# Patient Record
Sex: Female | Born: 2005
Health system: Southern US, Community
[De-identification: ages and names within clinical notes are randomized; demographics above are authoritative.]

## PROBLEM LIST (undated history)

## (undated) DIAGNOSIS — Z1589 Genetic susceptibility to other disease: Secondary | ICD-10-CM

## (undated) DIAGNOSIS — E7212 Methylenetetrahydrofolate reductase deficiency: Secondary | ICD-10-CM

## (undated) DIAGNOSIS — G90A Postural orthostatic tachycardia syndrome (POTS): Secondary | ICD-10-CM

## (undated) DIAGNOSIS — Q796 Ehlers-Danlos syndrome, unspecified: Secondary | ICD-10-CM

## (undated) DIAGNOSIS — M249 Joint derangement, unspecified: Secondary | ICD-10-CM

## (undated) DIAGNOSIS — J45909 Unspecified asthma, uncomplicated: Secondary | ICD-10-CM

## (undated) HISTORY — DX: Genetic susceptibility to other disease: Z15.89

## (undated) HISTORY — DX: Unspecified asthma, uncomplicated: J45.909

## (undated) HISTORY — DX: Joint derangement, unspecified: M24.9

---
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
## 2023-01-19 DIAGNOSIS — Z419 Encounter for procedure for purposes other than remedying health state, unspecified: Secondary | ICD-10-CM | POA: Diagnosis not present

## 2023-02-18 DIAGNOSIS — Z419 Encounter for procedure for purposes other than remedying health state, unspecified: Secondary | ICD-10-CM | POA: Diagnosis not present

## 2023-03-21 DIAGNOSIS — Z419 Encounter for procedure for purposes other than remedying health state, unspecified: Secondary | ICD-10-CM | POA: Diagnosis not present

## 2023-04-20 DIAGNOSIS — Z419 Encounter for procedure for purposes other than remedying health state, unspecified: Secondary | ICD-10-CM | POA: Diagnosis not present

## 2023-05-21 DIAGNOSIS — Z419 Encounter for procedure for purposes other than remedying health state, unspecified: Secondary | ICD-10-CM | POA: Diagnosis not present

## 2023-06-21 DIAGNOSIS — Z419 Encounter for procedure for purposes other than remedying health state, unspecified: Secondary | ICD-10-CM | POA: Diagnosis not present

## 2023-07-21 DIAGNOSIS — Z419 Encounter for procedure for purposes other than remedying health state, unspecified: Secondary | ICD-10-CM | POA: Diagnosis not present

## 2023-08-21 DIAGNOSIS — Z419 Encounter for procedure for purposes other than remedying health state, unspecified: Secondary | ICD-10-CM | POA: Diagnosis not present

## 2023-09-20 DIAGNOSIS — Z419 Encounter for procedure for purposes other than remedying health state, unspecified: Secondary | ICD-10-CM | POA: Diagnosis not present

## 2023-10-21 DIAGNOSIS — Z419 Encounter for procedure for purposes other than remedying health state, unspecified: Secondary | ICD-10-CM | POA: Diagnosis not present

## 2023-11-21 DIAGNOSIS — Z419 Encounter for procedure for purposes other than remedying health state, unspecified: Secondary | ICD-10-CM | POA: Diagnosis not present

## 2023-12-19 DIAGNOSIS — Z419 Encounter for procedure for purposes other than remedying health state, unspecified: Secondary | ICD-10-CM | POA: Diagnosis not present

## 2024-01-30 DIAGNOSIS — Z419 Encounter for procedure for purposes other than remedying health state, unspecified: Secondary | ICD-10-CM | POA: Diagnosis not present

## 2024-02-29 DIAGNOSIS — Z419 Encounter for procedure for purposes other than remedying health state, unspecified: Secondary | ICD-10-CM | POA: Diagnosis not present

## 2024-03-24 DIAGNOSIS — Z79899 Other long term (current) drug therapy: Secondary | ICD-10-CM | POA: Diagnosis not present

## 2024-03-24 DIAGNOSIS — F649 Gender identity disorder, unspecified: Secondary | ICD-10-CM | POA: Diagnosis not present

## 2024-03-24 DIAGNOSIS — F640 Transsexualism: Secondary | ICD-10-CM | POA: Diagnosis not present

## 2024-03-31 DIAGNOSIS — Z419 Encounter for procedure for purposes other than remedying health state, unspecified: Secondary | ICD-10-CM | POA: Diagnosis not present

## 2024-04-30 DIAGNOSIS — Z419 Encounter for procedure for purposes other than remedying health state, unspecified: Secondary | ICD-10-CM | POA: Diagnosis not present

## 2024-05-31 DIAGNOSIS — Z419 Encounter for procedure for purposes other than remedying health state, unspecified: Secondary | ICD-10-CM | POA: Diagnosis not present

## 2024-06-07 DIAGNOSIS — F649 Gender identity disorder, unspecified: Secondary | ICD-10-CM | POA: Diagnosis not present

## 2024-06-07 DIAGNOSIS — Z79890 Hormone replacement therapy (postmenopausal): Secondary | ICD-10-CM | POA: Diagnosis not present

## 2024-06-07 DIAGNOSIS — L649 Androgenic alopecia, unspecified: Secondary | ICD-10-CM | POA: Diagnosis not present

## 2024-06-07 DIAGNOSIS — F411 Generalized anxiety disorder: Secondary | ICD-10-CM | POA: Diagnosis not present

## 2024-06-07 DIAGNOSIS — F321 Major depressive disorder, single episode, moderate: Secondary | ICD-10-CM | POA: Diagnosis not present

## 2024-06-07 DIAGNOSIS — G901 Familial dysautonomia [Riley-Day]: Secondary | ICD-10-CM | POA: Diagnosis not present

## 2024-06-07 DIAGNOSIS — Q7962 Hypermobile Ehlers-Danlos syndrome: Secondary | ICD-10-CM | POA: Diagnosis not present
